# Patient Record
Sex: Female | Born: 1975 | Race: Black or African American | Hispanic: No | Marital: Married | Smoking: Never smoker
Health system: Southern US, Community
[De-identification: ages and names within clinical notes are randomized; demographics above are authoritative.]

## PROBLEM LIST (undated history)

## (undated) DIAGNOSIS — E559 Vitamin D deficiency, unspecified: Secondary | ICD-10-CM

## (undated) DIAGNOSIS — R7303 Prediabetes: Secondary | ICD-10-CM

## (undated) DIAGNOSIS — D219 Benign neoplasm of connective and other soft tissue, unspecified: Secondary | ICD-10-CM

---

## 2008-12-12 ENCOUNTER — Ambulatory Visit (HOSPITAL_COMMUNITY): Admission: RE | Admit: 2008-12-12 | Discharge: 2008-12-12 | Payer: Self-pay | Admitting: Obstetrics and Gynecology

## 2009-09-23 ENCOUNTER — Other Ambulatory Visit: Payer: Self-pay | Admitting: Emergency Medicine

## 2009-09-23 ENCOUNTER — Inpatient Hospital Stay (HOSPITAL_COMMUNITY): Admission: AD | Admit: 2009-09-23 | Discharge: 2009-09-23 | Payer: Self-pay | Admitting: Obstetrics and Gynecology

## 2010-01-06 ENCOUNTER — Inpatient Hospital Stay (HOSPITAL_COMMUNITY): Admission: AD | Admit: 2010-01-06 | Discharge: 2010-01-09 | Payer: Self-pay | Admitting: Obstetrics and Gynecology

## 2010-01-07 ENCOUNTER — Encounter (INDEPENDENT_AMBULATORY_CARE_PROVIDER_SITE_OTHER): Payer: Self-pay | Admitting: Obstetrics

## 2010-10-18 ENCOUNTER — Encounter: Payer: Self-pay | Admitting: *Deleted

## 2010-12-16 LAB — TYPE AND SCREEN: Antibody Screen: NEGATIVE

## 2010-12-16 LAB — URINE CULTURE
Colony Count: NO GROWTH
Culture: NO GROWTH

## 2010-12-16 LAB — CBC
HCT: 40.8 % (ref 36.0–46.0)
Hemoglobin: 12.2 g/dL (ref 12.0–15.0)
MCHC: 32.7 g/dL (ref 30.0–36.0)
MCHC: 33 g/dL (ref 30.0–36.0)
Platelets: 161 10*3/uL (ref 150–400)
Platelets: 169 10*3/uL (ref 150–400)
RDW: 15.2 % (ref 11.5–15.5)
RDW: 15.3 % (ref 11.5–15.5)

## 2010-12-16 LAB — PROTIME-INR: INR: 1.03 (ref 0.00–1.49)

## 2010-12-28 LAB — URINALYSIS, ROUTINE W REFLEX MICROSCOPIC
Glucose, UA: NEGATIVE mg/dL
Ketones, ur: NEGATIVE mg/dL
Nitrite: NEGATIVE
Protein, ur: NEGATIVE mg/dL
pH: 6.5 (ref 5.0–8.0)

## 2010-12-28 LAB — URINE MICROSCOPIC-ADD ON

## 2010-12-28 LAB — ABO/RH: ABO/RH(D): O POS

## 2010-12-28 LAB — URINE CULTURE: Colony Count: 9000

## 2010-12-28 LAB — BASIC METABOLIC PANEL
BUN: 9 mg/dL (ref 6–23)
Chloride: 105 mEq/L (ref 96–112)
GFR calc non Af Amer: >60 mL/min (ref >60)
Potassium: 4 mEq/L (ref 3.5–5.1)
Sodium: 139 mEq/L (ref 135–145)

## 2010-12-28 LAB — DIFFERENTIAL
Basophils Absolute: 0.2 10*3/uL — ABNORMAL HIGH (ref 0.0–0.1)
Eosinophils Relative: 1 % (ref 0–5)
Lymphocytes Relative: 18 % (ref 12–46)
Lymphs Abs: 2.4 10*3/uL (ref 0.7–4.0)
Monocytes Absolute: 1 10*3/uL (ref 0.1–1.0)
Neutro Abs: 9.9 10*3/uL — ABNORMAL HIGH (ref 1.7–7.7)

## 2010-12-28 LAB — CBC
HCT: 35 % — ABNORMAL LOW (ref 36.0–46.0)
Hemoglobin: 11.8 g/dL — ABNORMAL LOW (ref 12.0–15.0)
RBC: 3.89 MIL/uL (ref 3.87–5.11)
WBC: 13.6 10*3/uL — ABNORMAL HIGH (ref 4.0–10.5)

## 2011-03-09 ENCOUNTER — Emergency Department (HOSPITAL_BASED_OUTPATIENT_CLINIC_OR_DEPARTMENT_OTHER)
Admission: EM | Admit: 2011-03-09 | Discharge: 2011-03-09 | Disposition: A | Discharge status: Home / Self Care | Payer: BC Managed Care – PPO | Attending: Emergency Medicine | Admitting: Emergency Medicine

## 2011-03-09 DIAGNOSIS — R51 Headache: Secondary | ICD-10-CM | POA: Insufficient documentation

## 2011-03-09 LAB — URINALYSIS, ROUTINE W REFLEX MICROSCOPIC
Glucose, UA: NEGATIVE mg/dL
Ketones, ur: 15 mg/dL — AB
Leukocytes, UA: NEGATIVE
Nitrite: NEGATIVE
Protein, ur: NEGATIVE mg/dL

## 2011-03-09 LAB — PREGNANCY, URINE: Preg Test, Ur: NEGATIVE

## 2015-04-11 ENCOUNTER — Other Ambulatory Visit: Payer: Self-pay | Admitting: Obstetrics and Gynecology

## 2015-04-11 DIAGNOSIS — N921 Excessive and frequent menstruation with irregular cycle: Secondary | ICD-10-CM

## 2015-04-11 DIAGNOSIS — D259 Leiomyoma of uterus, unspecified: Secondary | ICD-10-CM

## 2015-04-30 ENCOUNTER — Ambulatory Visit
Admission: RE | Admit: 2015-04-30 | Discharge: 2015-04-30 | Disposition: A | Discharge status: Home / Self Care | Payer: BC Managed Care – PPO | Source: Ambulatory Visit | Attending: Obstetrics and Gynecology | Admitting: Obstetrics and Gynecology

## 2015-04-30 ENCOUNTER — Other Ambulatory Visit: Payer: Self-pay | Admitting: Obstetrics and Gynecology

## 2015-04-30 DIAGNOSIS — N921 Excessive and frequent menstruation with irregular cycle: Secondary | ICD-10-CM

## 2015-04-30 DIAGNOSIS — D259 Leiomyoma of uterus, unspecified: Secondary | ICD-10-CM

## 2015-04-30 HISTORY — DX: Prediabetes: R73.03

## 2015-04-30 HISTORY — DX: Benign neoplasm of connective and other soft tissue, unspecified: D21.9

## 2015-04-30 HISTORY — DX: Vitamin D deficiency, unspecified: E55.9

## 2015-04-30 NOTE — Consult Note (Signed)
Chief Complaint: Patient was seen in consultation today for  Chief Complaint  Patient presents with  . Advice Only    Consult for [place]   at the request of Cousins,Sheronette  Referring Physician(s): Cousins,Sheronette  History of Present Illness: Carla Gonzales is a 39 y.o. female G1 P1. No future pregnancy plans. Review of her menstrual cycle demonstrates a 28 day cycle. Menses last 7 days with 5 heavy days including lower abdominal cramping. Frequency of pad changes every 2 hours. She has interperiod bleeding as well. She has passage of blood clots during the cycle. She also has urinary frequency and urgency as well as abdominal bloating and cramping. No current fibroid therapies including birth-control pills or hormone replacement therapies. No prior fibroid surgeries or GYN infection. Most recent Pap smear 10/22/2013 was negative. She presents for outpatient evaluation and treatment of the symptomatic uterine fibroids today.  Past Medical History  Diagnosis Date  . Fibroids   . Pre-diabetes   . Vitamin D deficiency     No past surgical history on file.  Allergies: Review of patient's allergies indicates not on file.  Medications: Prior to Admission medications   Not on File     No family history on file.  History   Social History  . Marital Status: Married    Spouse Name: N/A  . Number of Children: N/A  . Years of Education: N/A   Social History Main Topics  . Smoking status: Never Smoker   . Smokeless tobacco: Never Used  . Alcohol Use: No  . Drug Use: No  . Sexual Activity: Not on file   Other Topics Concern  . None   Social History Narrative  . None     Review of Systems: A 12 point ROS discussed and pertinent positives are indicated in the HPI above.  All other systems are negative.  Review of Systems  Constitutional: Negative for fever, diaphoresis, activity change, appetite change and unexpected weight change.  Respiratory: Negative for  cough and shortness of breath.   Cardiovascular: Negative for chest pain.  Gastrointestinal: Negative for abdominal distention.  Genitourinary: Positive for urgency, frequency, vaginal bleeding and menstrual problem.    Vital Signs: BP 121/77 mmHg  Pulse 76  Temp(Src) 98.7 F (37.1 C) (Oral)  Resp 13  Ht 5\' 4"  (1.626 m)  Wt 158 lb (71.668 kg)  BMI 27.11 kg/m2  SpO2 100%  LMP 03/31/2015 (Approximate)  Physical Exam  Constitutional: She appears well-developed and well-nourished. No distress.  Cardiovascular: Normal rate and regular rhythm.   No murmur heard. Pulmonary/Chest: Effort normal. No respiratory distress. She has no wheezes.  Abdominal: Soft. Bowel sounds are normal. She exhibits no distension and no mass. There is no rebound and no guarding. No hernia.  No palpable enlarged uterus on exam.  Skin: Skin is warm and dry. No rash noted. She is not diaphoretic. No erythema.  Psychiatric: She has a normal mood and affect. Her behavior is normal.     Assessment and Plan:  39 year old female with symptomatic uterine fibroids resulting in heavy menstrual bleeding, passage of blood clots and interperiod bleeding. She also has urinary frequency and urgency. Treatment options including uterine artery embolization were reviewed. This procedure was reviewed in detail including the evaluation, procedure, overnight hospitalization, recovery phase, expected goals and outcomes. All questions were addressed. She has a full understanding of the procedure. She would like to proceed with the workup including a pelvic MRI without and with  contrast. This will be scheduled in the next few days to assess her fibroid anatomy for embolization.  Plan: Pelvic MRI as above. If she is an appropriate candidate for embolization she would like to electively scheduled the procedure, most likely in December 2016 over the holiday break. 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  Thank  you for this interesting consult.  I greatly enjoyed meeting Carla Gonzales and look forward to participating in their care.  A copy of this report was sent to the requesting provider on this date.  SignedGreggory Keen 04/30/2015, 2:21 PM   I spent a total of  30 Minutes   in face to face in clinical consultation, greater than 50% of which was counseling/coordinating care for this patient with symptomatic uterine fibroids and heavy menstrual bleeding.

## 2015-05-08 ENCOUNTER — Ambulatory Visit
Admission: RE | Admit: 2015-05-08 | Discharge: 2015-05-08 | Disposition: A | Discharge status: Home / Self Care | Payer: BC Managed Care – PPO | Source: Ambulatory Visit | Attending: Obstetrics and Gynecology | Admitting: Obstetrics and Gynecology

## 2015-05-08 DIAGNOSIS — N921 Excessive and frequent menstruation with irregular cycle: Secondary | ICD-10-CM

## 2015-05-08 DIAGNOSIS — D259 Leiomyoma of uterus, unspecified: Secondary | ICD-10-CM

## 2015-05-08 MED ORDER — GADOBENATE DIMEGLUMINE 529 MG/ML IV SOLN: 15.0000 mL | Freq: Once | INTRAVENOUS | Status: DC | PRN

## 2015-05-22 ENCOUNTER — Telehealth: Payer: Self-pay | Admitting: Interventional Radiology

## 2015-05-22 NOTE — Progress Notes (Signed)
MRI shows 2 small non vascular fibroids, 1 measures 2cm and the second only 1.5cm Normal uterus and endometrium by MRI .  Ovaries unremarkable  I don't think she would improve with UFE given the small fibroid size and lack of enhancement /vascularity.  This was discussed with the patient and a message was left for Dr. Garwin Brothers

## 2015-07-17 ENCOUNTER — Encounter (HOSPITAL_BASED_OUTPATIENT_CLINIC_OR_DEPARTMENT_OTHER): Payer: Self-pay | Admitting: *Deleted

## 2015-07-17 ENCOUNTER — Emergency Department (HOSPITAL_BASED_OUTPATIENT_CLINIC_OR_DEPARTMENT_OTHER)
Admission: EM | Admit: 2015-07-17 | Discharge: 2015-07-17 | Disposition: A | Discharge status: Home / Self Care | Payer: BC Managed Care – PPO | Attending: Emergency Medicine | Admitting: Emergency Medicine

## 2015-07-17 ENCOUNTER — Emergency Department (HOSPITAL_BASED_OUTPATIENT_CLINIC_OR_DEPARTMENT_OTHER): Payer: BC Managed Care – PPO

## 2015-07-17 DIAGNOSIS — Z86018 Personal history of other benign neoplasm: Secondary | ICD-10-CM | POA: Diagnosis not present

## 2015-07-17 DIAGNOSIS — R200 Anesthesia of skin: Secondary | ICD-10-CM | POA: Insufficient documentation

## 2015-07-17 DIAGNOSIS — R079 Chest pain, unspecified: Secondary | ICD-10-CM

## 2015-07-17 DIAGNOSIS — Z8639 Personal history of other endocrine, nutritional and metabolic disease: Secondary | ICD-10-CM | POA: Insufficient documentation

## 2015-07-17 LAB — BASIC METABOLIC PANEL
Anion gap: 5 (ref 5–15)
BUN: 16 mg/dL (ref 6–20)
CO2: 26 mmol/L (ref 22–32)
Calcium: 8.9 mg/dL (ref 8.9–10.3)
Chloride: 104 mmol/L (ref 101–111)
Creatinine, Ser: 0.88 mg/dL (ref 0.44–1.00)
GFR calc Af Amer: >60 mL/min (ref >60)
GLUCOSE: 91 mg/dL (ref 65–99)
POTASSIUM: 3.7 mmol/L (ref 3.5–5.1)
Sodium: 135 mmol/L (ref 135–145)

## 2015-07-17 LAB — CBC WITH DIFFERENTIAL/PLATELET
Basophils Absolute: 0 10*3/uL (ref 0.0–0.1)
Basophils Relative: 0 %
EOS PCT: 1 %
Eosinophils Absolute: 0.1 10*3/uL (ref 0.0–0.7)
HCT: 40.7 % (ref 36.0–46.0)
Hemoglobin: 13.5 g/dL (ref 12.0–15.0)
LYMPHS ABS: 3.7 10*3/uL (ref 0.7–4.0)
LYMPHS PCT: 40 %
MCH: 28.2 pg (ref 26.0–34.0)
MCHC: 33.2 g/dL (ref 30.0–36.0)
MCV: 85 fL (ref 78.0–100.0)
MONO ABS: 0.7 10*3/uL (ref 0.1–1.0)
Monocytes Relative: 8 %
Neutro Abs: 4.7 10*3/uL (ref 1.7–7.7)
Neutrophils Relative %: 51 %
PLATELETS: 268 10*3/uL (ref 150–400)
RBC: 4.79 MIL/uL (ref 3.87–5.11)
RDW: 13.6 % (ref 11.5–15.5)
WBC: 9.2 10*3/uL (ref 4.0–10.5)

## 2015-07-17 LAB — TROPONIN I: Troponin I: <0.03 ng/mL (ref <0.031)

## 2015-07-17 MED ORDER — SODIUM CHLORIDE 0.9 % IV BOLUS (SEPSIS): 1000.0000 mL | Freq: Once | INTRAVENOUS | Status: DC

## 2015-07-17 MED ORDER — ONDANSETRON HCL 4 MG/2ML IJ SOLN: 4.0000 mg | Freq: Once | INTRAMUSCULAR | Status: DC

## 2015-07-17 MED ORDER — MORPHINE SULFATE (PF) 4 MG/ML IV SOLN: 4.0000 mg | Freq: Once | INTRAVENOUS | Status: DC

## 2015-07-17 NOTE — ED Provider Notes (Signed)
CSN: [id number]     Arrival date & time 07/17/15  1748 History  By signing my name below, I, Carla Gonzales, attest that this documentation has been prepared under the direction and in the presence of Veryl Speak, MD.  Electronically Signed: Tula Gonzales, ED Scribe. 07/17/2015. 6:20 PM.   Chief Complaint  Patient presents with  . Chest Pain   Patient is a 39 y.o. female presenting with chest pain. The history is provided by the patient. No language interpreter was used.  Chest Pain Pain location:  L chest Pain quality: sharp   Pain radiates to:  Does not radiate Pain radiates to the back: no   Pain severity:  Mild Onset quality:  Sudden Duration:  1 week Timing:  Intermittent Progression:  Unchanged Chronicity:  New Context: not eating and no movement   Associated symptoms: numbness   Associated symptoms: no cough and no shortness of breath   Risk factors: no smoking     HPI Comments: Carla Gonzales is a 39 y.o. female who presents to the Emergency Department complaining of intermittent, mild, sharp, brief chest pain that started 1 week ago and became more frequent yesterday. She states intermittent tingling of her left arm that does not coincide with her pain as an associated symptom. Pt reports that tingling sensation will sometimes occur after the CP has subsided, but also occurs when there is no recent pain. She has not identified any triggers for her symptoms, including food, drink or activity. She denies SOB with exertion. Pt is pretty active, but does not regularly exercise. She has elevated cholesterol, but does not take any regular medications. Pt denies smoking and a family history of CAD less than 66 y.o. She also denies leg swelling, calf pain and cough as associated symptoms.  Past Medical History  Diagnosis Date  . Fibroids   . Pre-diabetes   . Vitamin D deficiency    History reviewed. No pertinent past surgical history. No family history on file. Social History   Substance Use Topics  . Smoking status: Never Smoker   . Smokeless tobacco: Never Used  . Alcohol Use: No   OB History    No data available     Review of Systems  Respiratory: Negative for cough and shortness of breath.   Cardiovascular: Positive for chest pain. Negative for leg swelling.  Musculoskeletal: Negative for arthralgias.  Neurological: Positive for numbness.  All other systems reviewed and are negative.     Allergies  Review of patient's allergies indicates no known allergies.  Home Medications   Prior to Admission medications   Not on File   BP 125/82 mmHg  Pulse 75  Temp(Src) 98.5 F (36.9 C) (Oral)  Resp 18  Ht 5\' 4"  (1.626 m)  Wt 146 lb (66.225 kg)  BMI 25.05 kg/m2  SpO2 100%  LMP 06/28/2015 Physical Exam  Constitutional: She is oriented to person, place, and time. She appears well-developed and well-nourished. No distress.  HENT:  Head: Normocephalic and atraumatic.  Eyes: Conjunctivae and EOM are normal. Pupils are equal, round, and reactive to light.  Neck: Neck supple. No tracheal deviation present.  Cardiovascular: Normal rate, regular rhythm and normal heart sounds.   Pulmonary/Chest: Effort normal and breath sounds normal. No respiratory distress.  Abdominal: Soft. There is no tenderness.  Musculoskeletal: Normal range of motion. She exhibits no edema.  Neurological: She is alert and oriented to person, place, and time.  Skin: Skin is warm and dry.  Psychiatric: She has  a normal mood and affect. Her behavior is normal.  Nursing note and vitals reviewed.   ED Course  Procedures  DIAGNOSTIC STUDIES: Oxygen Saturation is 100% on RA, normal by my interpretation.    COORDINATION OF CARE: 6:19 PM Discussed treatment plan with pt which includes lab work. Pt agreed to plan.   Labs Review Labs Reviewed - No data to display  Imaging Review No results found. I have personally reviewed and evaluated these lab results as part of my medical  decision-making.   EKG Interpretation   Date/Time:  Thursday July 17 2015 18:02:36 EDT Ventricular Rate:  70 PR Interval:  174 QRS Duration: 72 QT Interval:  416 QTC Calculation: 449 R Axis:   59 Text Interpretation:  Normal sinus rhythm Normal ECG Confirmed by Teriah Muela   MD, Nahima Ales ([id number]) on 07/17/2015 6:19:10 PM      MDM   Final diagnoses:  None    Patient presents with complaints of intermittent sharp pains in her chest for the last week. These come and go at random and last only seconds. Her symptoms are very atypical for cardiac pain and there is nothing in the workup to suggest a cardiac etiology. Her chest x-ray is clear. I highly doubt a pulmonary embolism. I strongly suspect this is musculoskeletal in nature. I will recommend ibuprofen, rest, and when necessary return.  Allena Napoleon, personally performed the services described in this documentation. All medical record entries made by the scribe were at my direction and in my presence.  I have reviewed the chart and discharge instructions and agree that the record reflects my personal performance and is accurate and complete. Veryl Speak.  07/17/2015. 8:02 PM.       Veryl Speak, MD 07/17/15 2002

## 2015-07-17 NOTE — ED Notes (Signed)
Sharp pain in her left chest for a week. Takes her breath. Her left arm feels numb at times. Back pain and headache.

## 2015-07-17 NOTE — ED Notes (Signed)
MD at bedside. 

## 2015-07-17 NOTE — ED Notes (Signed)
Patient transported to X-ray 

## 2015-07-17 NOTE — Discharge Instructions (Signed)
°  Ibuprofen 600 mg every 6 hours as needed.  Return to the emergency department if your symptoms significantly worsen or change.   Chest Wall Pain Chest wall pain is pain in or around the bones and muscles of your chest. Sometimes, an injury causes this pain. Sometimes, the cause may not be known. This pain may take several weeks or longer to get better. HOME CARE INSTRUCTIONS  Pay attention to any changes in your symptoms. Take these actions to help with your pain:   Rest as told by your health care provider.   Avoid activities that cause pain. These include any activities that use your chest muscles or your abdominal and side muscles to lift heavy items.   If directed, apply ice to the painful area:  Put ice in a plastic bag.  Place a towel between your skin and the bag.  Leave the ice on for 20 minutes, 2-3 times per day.  Take over-the-counter and prescription medicines only as told by your health care provider.  Do not use tobacco products, including cigarettes, chewing tobacco, and e-cigarettes. If you need help quitting, ask your health care provider.  Keep all follow-up visits as told by your health care provider. This is important. SEEK MEDICAL CARE IF:  You have a fever.  Your chest pain becomes worse.  You have new symptoms. SEEK IMMEDIATE MEDICAL CARE IF:  You have nausea or vomiting.  You feel sweaty or light-headed.  You have a cough with phlegm (sputum) or you cough up blood.  You develop shortness of breath.   This information is not intended to replace advice given to you by your health care provider. Make sure you discuss any questions you have with your health care provider.   Document Released: 09/13/2005 Document Revised: 06/04/2015 Document Reviewed: 12/09/2014 Elsevier Interactive Patient Education [employer].

## 2017-01-18 IMAGING — DX DG CHEST 2V
2 series · 2 of 2 positions shown · non-contrast
Comparison: None.

CLINICAL DATA: One week history of left-sided chest pain and left
arm numbness.

EXAM:
CHEST  2 VIEW

[chest pa]
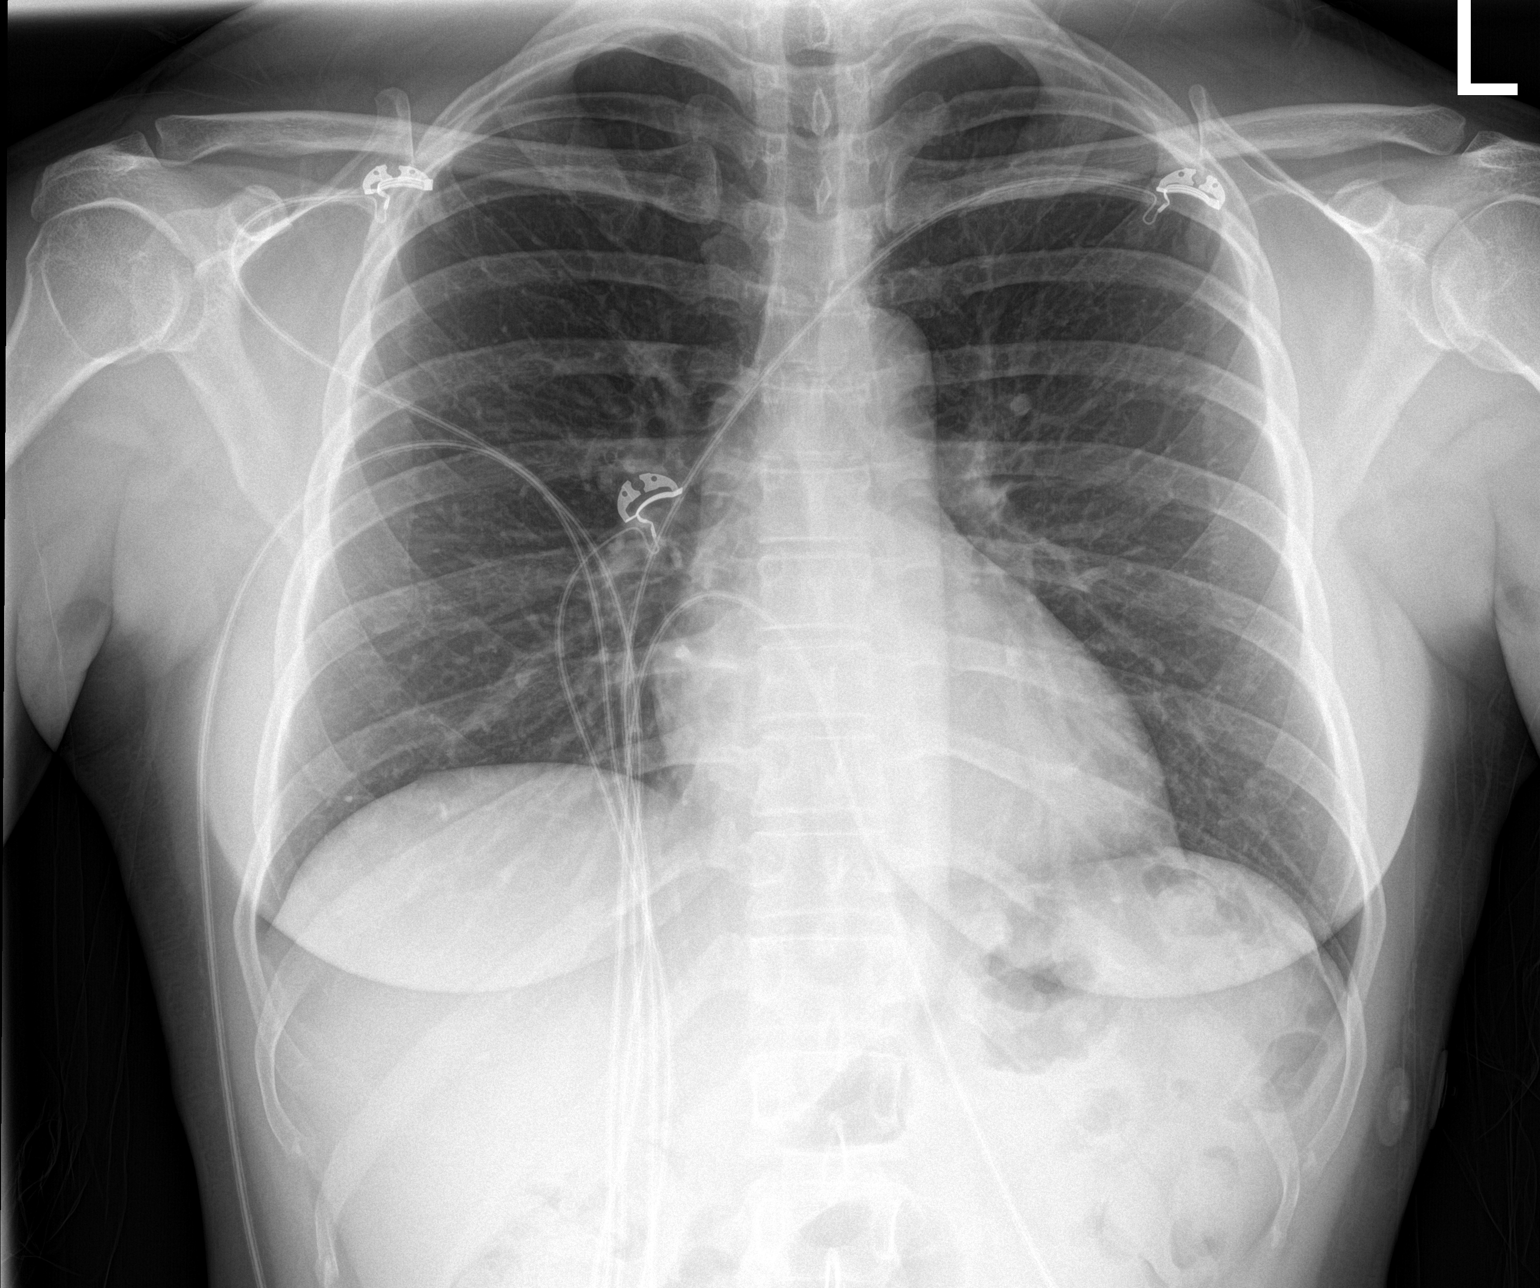

[chest lat]
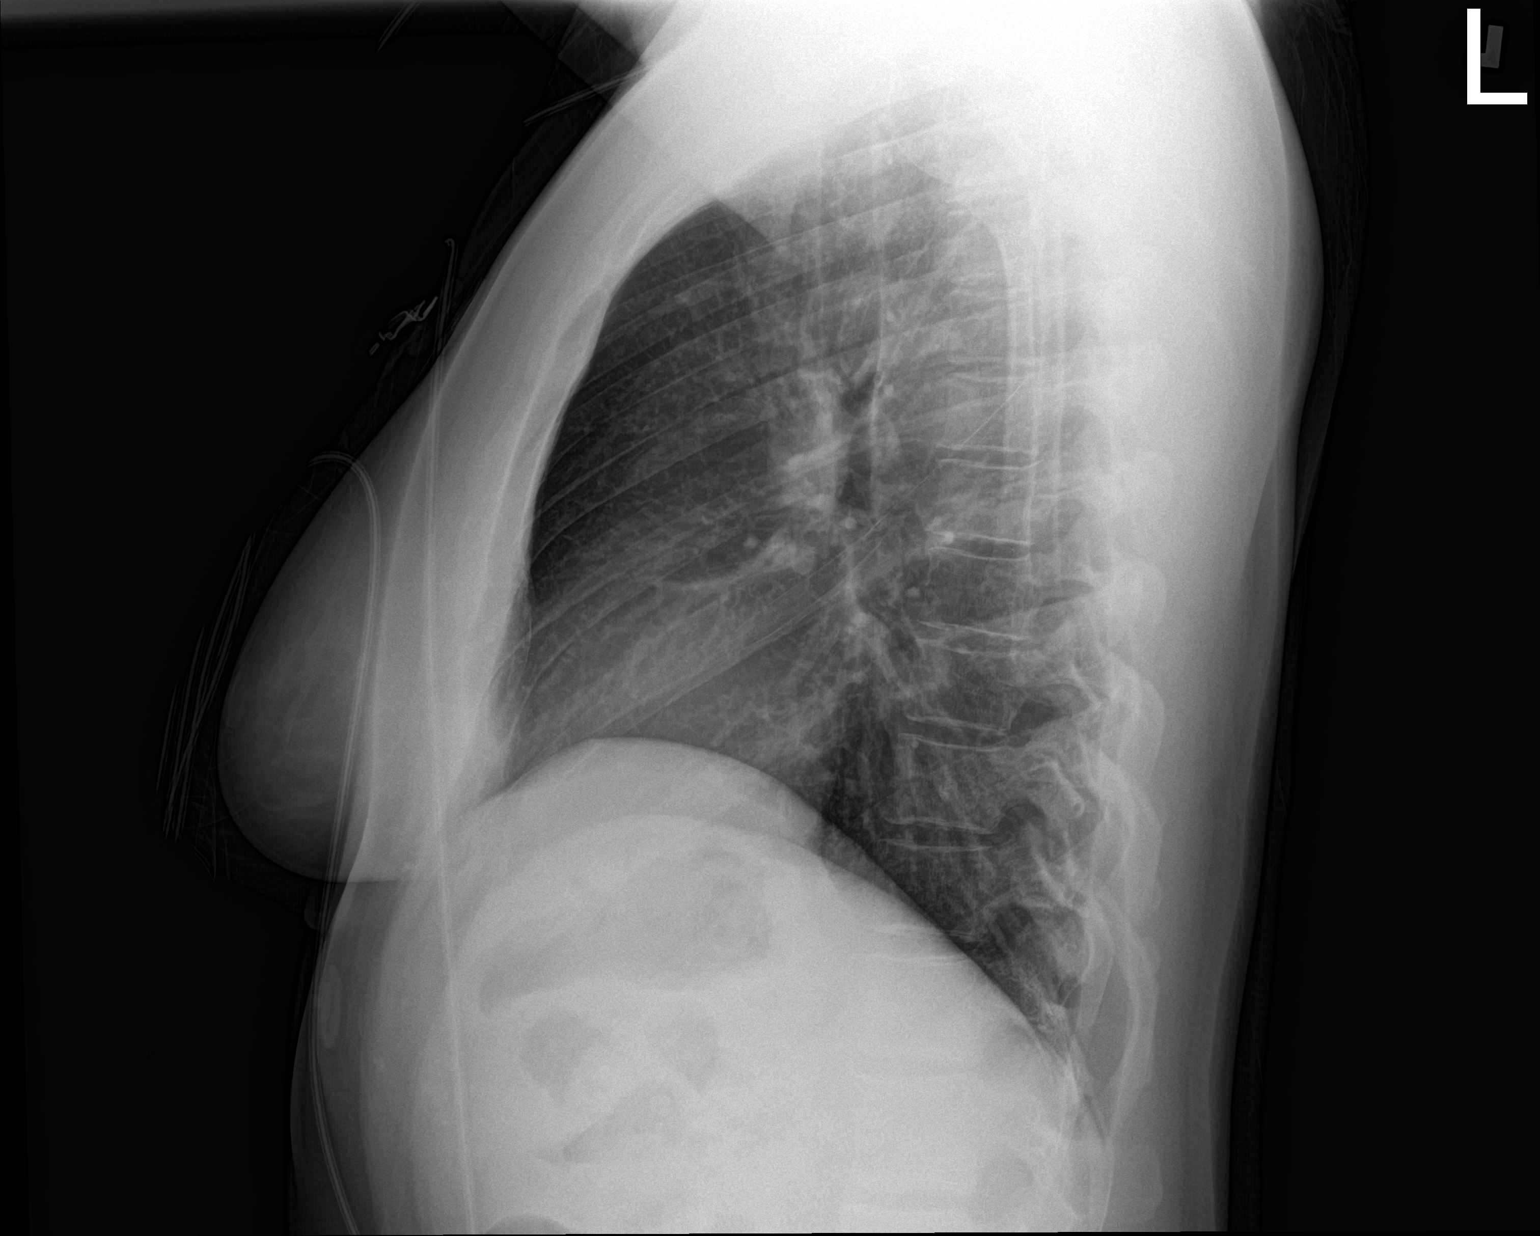

[2 of 2 positions shown; findings below may reference images not displayed]

FINDINGS: The heart size and mediastinal contours are within normal limits.
Both lungs are clear. The visualized skeletal structures are
unremarkable.
IMPRESSION: Normal chest x-ray.
